# Patient Record
Sex: Female | Born: 1990 | Race: Black or African American | Hispanic: No | Marital: Single | Smoking: Never smoker
Health system: Southern US, Community
[De-identification: ages and names within clinical notes are randomized; demographics above are authoritative.]

## PROBLEM LIST (undated history)

## (undated) ENCOUNTER — Emergency Department (HOSPITAL_BASED_OUTPATIENT_CLINIC_OR_DEPARTMENT_OTHER): Admission: EM | Payer: BC Managed Care – PPO | Source: Home / Self Care

## (undated) DIAGNOSIS — R87619 Unspecified abnormal cytological findings in specimens from cervix uteri: Secondary | ICD-10-CM

## (undated) DIAGNOSIS — A64 Unspecified sexually transmitted disease: Secondary | ICD-10-CM

## (undated) HISTORY — DX: Unspecified abnormal cytological findings in specimens from cervix uteri: R87.619

## (undated) HISTORY — DX: Unspecified sexually transmitted disease: A64

---
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
## 2010-06-25 LAB — WET PREP, GENITAL: Clue Cells Wet Prep HPF POC: NONE SEEN

## 2010-06-25 LAB — GC/CHLAMYDIA PROBE AMP, GENITAL: GC Probe Amp, Genital: NEGATIVE

## 2010-06-25 LAB — POCT URINALYSIS DIPSTICK
Hgb urine dipstick: NEGATIVE
Ketones, ur: NEGATIVE mg/dL
Protein, ur: NEGATIVE mg/dL
Specific Gravity, Urine: 1.02 (ref 1.005–1.030)
pH: 7 (ref 5.0–8.0)

## 2010-06-25 LAB — POCT PREGNANCY, URINE: Preg Test, Ur: NEGATIVE

## 2010-06-28 LAB — POCT URINALYSIS DIP (DEVICE)
Glucose, UA: NEGATIVE mg/dL
Hgb urine dipstick: NEGATIVE
Nitrite: NEGATIVE
Urobilinogen, UA: 2 mg/dL — ABNORMAL HIGH (ref 0.0–1.0)
pH: 5.5 (ref 5.0–8.0)

## 2010-06-28 LAB — GC/CHLAMYDIA PROBE AMP, GENITAL: GC Probe Amp, Genital: NEGATIVE

## 2010-06-28 LAB — WET PREP, GENITAL

## 2010-07-19 ENCOUNTER — Emergency Department (HOSPITAL_COMMUNITY): Payer: BC Managed Care – HMO

## 2010-07-19 ENCOUNTER — Emergency Department (HOSPITAL_COMMUNITY)
Admission: EM | Admit: 2010-07-19 | Discharge: 2010-07-20 | Disposition: A | Discharge status: Home / Self Care | Payer: BC Managed Care – HMO | Attending: Emergency Medicine | Admitting: Emergency Medicine

## 2010-07-19 DIAGNOSIS — M545 Low back pain, unspecified: Secondary | ICD-10-CM | POA: Insufficient documentation

## 2010-07-19 DIAGNOSIS — R000 Tachycardia, unspecified: Secondary | ICD-10-CM | POA: Insufficient documentation

## 2010-07-19 DIAGNOSIS — J069 Acute upper respiratory infection, unspecified: Secondary | ICD-10-CM | POA: Insufficient documentation

## 2010-07-19 DIAGNOSIS — R110 Nausea: Secondary | ICD-10-CM | POA: Insufficient documentation

## 2010-07-19 LAB — POCT I-STAT, CHEM 8
BUN: 5 mg/dL — ABNORMAL LOW (ref 6–23)
Calcium, Ion: 1.11 mmol/L — ABNORMAL LOW (ref 1.12–1.32)
Creatinine, Ser: 1 mg/dL (ref 0.4–1.2)
Glucose, Bld: 78 mg/dL (ref 70–99)
TCO2: 19 mmol/L (ref 0–100)

## 2010-07-19 LAB — URINE MICROSCOPIC-ADD ON

## 2010-07-19 LAB — URINALYSIS, ROUTINE W REFLEX MICROSCOPIC
Protein, ur: NEGATIVE mg/dL
Urobilinogen, UA: 1 mg/dL (ref 0.0–1.0)

## 2010-07-19 LAB — POCT PREGNANCY, URINE: Preg Test, Ur: NEGATIVE

## 2011-04-13 DIAGNOSIS — A64 Unspecified sexually transmitted disease: Secondary | ICD-10-CM

## 2011-04-13 HISTORY — DX: Unspecified sexually transmitted disease: A64

## 2011-10-25 ENCOUNTER — Encounter (HOSPITAL_COMMUNITY): Payer: Self-pay

## 2011-10-25 ENCOUNTER — Emergency Department (INDEPENDENT_AMBULATORY_CARE_PROVIDER_SITE_OTHER)
Admission: EM | Admit: 2011-10-25 | Discharge: 2011-10-25 | Disposition: A | Discharge status: Home / Self Care | Payer: BC Managed Care – HMO | Source: Home / Self Care | Attending: Emergency Medicine | Admitting: Emergency Medicine

## 2011-10-25 DIAGNOSIS — J039 Acute tonsillitis, unspecified: Secondary | ICD-10-CM

## 2011-10-25 DIAGNOSIS — A499 Bacterial infection, unspecified: Secondary | ICD-10-CM

## 2011-10-25 DIAGNOSIS — B9689 Other specified bacterial agents as the cause of diseases classified elsewhere: Secondary | ICD-10-CM

## 2011-10-25 DIAGNOSIS — N760 Acute vaginitis: Secondary | ICD-10-CM

## 2011-10-25 LAB — POCT URINALYSIS DIP (DEVICE)
Bilirubin Urine: NEGATIVE
Ketones, ur: NEGATIVE mg/dL
Protein, ur: 100 mg/dL — AB

## 2011-10-25 LAB — POCT PREGNANCY, URINE: Preg Test, Ur: NEGATIVE

## 2011-10-25 MED ORDER — METRONIDAZOLE 500 MG PO TABS: 500.0000 mg | ORAL_TABLET | Freq: Two times a day (BID) | ORAL | Status: AC

## 2011-10-25 MED ORDER — AZITHROMYCIN 250 MG PO TABS: 250.0000 mg | ORAL_TABLET | Freq: Every day | ORAL | Status: AC

## 2011-10-25 NOTE — ED Provider Notes (Signed)
History     CSN: [id number]  Arrival date & time 10/25/11  1630   First MD Initiated Contact with Patient 10/25/11 1736      Chief Complaint  Patient presents with  . Vaginitis    (Consider location/radiation/quality/duration/timing/severity/associated sxs/prior treatment) Patient is a 21 y.o. female presenting with vaginal discharge. The history is provided by the patient. No language interpreter was used.  Vaginal Discharge This is a new problem. The problem occurs constantly. The problem has been gradually worsening. Nothing aggravates the symptoms. Nothing relieves the symptoms. She has tried nothing for the symptoms.  Pt complains of a vaginal odor  and a sore throat.  Pt has had Bv in the past and thinks this is the same  History reviewed. No pertinent past medical history.  History reviewed. No pertinent past surgical history.  History reviewed. No pertinent family history.  History  Substance Use Topics  . Smoking status: Never Smoker   . Smokeless tobacco: Not on file  . Alcohol Use: Yes    OB History    Grav Para Term Preterm Abortions TAB SAB Ect Mult Living                  Review of Systems  HENT: Positive for sore throat.   Genitourinary: Positive for vaginal discharge.  All other systems reviewed and are negative.    Allergies  Penicillins  Home Medications   Current Outpatient Rx  Name Route Sig Dispense Refill  . ETHYNODIOL DIAC-ETH ESTRADIOL 1-50 MG-MCG PO TABS Oral Take 1 tablet by mouth daily.      BP 143/89  Pulse 104  Temp 100.2 F (37.9 C) (Oral)  Resp 18  SpO2 100%  LMP 10/21/2011  Physical Exam  Nursing note and vitals reviewed. Constitutional: She is oriented to person, place, and time. She appears well-developed.  HENT:  Head: Normocephalic.  Right Ear: External ear normal.  Left Ear: External ear normal.  Mouth/Throat: Oropharyngeal exudate present.  Eyes: Conjunctivae and EOM are normal. Pupils are equal, round, and  reactive to light.  Neck: Normal range of motion. Neck supple.  Cardiovascular: Normal rate and normal heart sounds.   Pulmonary/Chest: Effort normal.  Abdominal: Soft.  Genitourinary: Vaginal discharge found.  Musculoskeletal:       Thin white discharge  Neurological: She is oriented to person, place, and time.  Skin: Skin is warm.  Psychiatric: She has a normal mood and affect.    ED Course  Procedures (including critical care time)   Labs Reviewed  POCT RAPID STREP A ([facility] CARE ONLY)  WET PREP, GENITAL  GC/CHLAMYDIA PROBE AMP, GENITAL   No results found.   1. Bacterial vaginitis   2. Tonsillitis       MDM  Wet prep shows clue cells,   I will treat tonsillitis with zithromax and flagyl        Elson Areas, [place] 10/25/11 1842

## 2011-10-25 NOTE — ED Provider Notes (Signed)
Medical screening examination/treatment/procedure(s) were performed by non-physician practitioner and as supervising physician I was immediately available for consultation/collaboration.  Leslee Home, M.D.   Reuben Likes, MD 10/25/11 2127

## 2011-10-25 NOTE — ED Notes (Signed)
After lengthy discussion w pt, she decided she WILL take her Rx for infection; call back number for lab issues verified

## 2011-10-25 NOTE — ED Notes (Addendum)
C/o 2 day duration of vaginal d/c, ST ; partner has had no c/o; c/o ST w red tonsils and junk on her tonsils

## 2011-10-26 LAB — GC/CHLAMYDIA PROBE AMP, GENITAL
Chlamydia, DNA Probe: POSITIVE — AB
GC Probe Amp, Genital: NEGATIVE

## 2011-11-01 ENCOUNTER — Telehealth (HOSPITAL_COMMUNITY): Payer: Self-pay | Admitting: *Deleted

## 2011-11-01 NOTE — ED Notes (Addendum)
GC neg., Chlamydia pos., Wet prep: few clue cells, few WBC's.  Pt. adequately treated with Zithromax 250 mg. # 6 ( was given for tonsilitis). I called and left a message to call on home and cell numbers.  Discussed with Dr. Lorenz Coaster and he said he does not think the tx. was adequate. He said to call in Zithromax 1 gm po x 1 dose.  DHHS form completed and will be faxed to the [facility] after Rx. called to the pharmacy.

## 2011-11-01 NOTE — ED Notes (Signed)
Pt. Called back.  Pt. verified x 2 and given results.  Pt. told she needs another one time treatment dose of Zithromax. Pt. instructed to notify her partner, no sex for 1 week and to practice safe sex. Pt. told she can get HIV testing at the [facility]. STD clinic.  She said she would call me back with the pharmacy. Vassie Moselle 11/01/2011

## 2011-11-01 NOTE — ED Notes (Signed)
Pt. called back and wants Rx. called to [facility] on [address].  Rx. called to pharmacist @ [phone].  DHHS form faxed to the [facility]. Vassie Moselle 11/01/2011

## 2012-03-09 ENCOUNTER — Encounter (HOSPITAL_COMMUNITY): Payer: Self-pay

## 2012-03-09 ENCOUNTER — Emergency Department (HOSPITAL_COMMUNITY)
Admission: EM | Admit: 2012-03-09 | Discharge: 2012-03-09 | Disposition: A | Discharge status: Home / Self Care | Payer: Self-pay | Attending: Emergency Medicine | Admitting: Emergency Medicine

## 2012-03-09 DIAGNOSIS — F1290 Cannabis use, unspecified, uncomplicated: Secondary | ICD-10-CM

## 2012-03-09 DIAGNOSIS — F10929 Alcohol use, unspecified with intoxication, unspecified: Secondary | ICD-10-CM

## 2012-03-09 DIAGNOSIS — F1010 Alcohol abuse, uncomplicated: Secondary | ICD-10-CM | POA: Insufficient documentation

## 2012-03-09 DIAGNOSIS — R450 Nervousness: Secondary | ICD-10-CM | POA: Insufficient documentation

## 2012-03-09 DIAGNOSIS — J705 Respiratory conditions due to smoke inhalation: Secondary | ICD-10-CM | POA: Insufficient documentation

## 2012-03-09 DIAGNOSIS — R443 Hallucinations, unspecified: Secondary | ICD-10-CM | POA: Insufficient documentation

## 2012-03-09 DIAGNOSIS — F1210 Cannabis abuse, uncomplicated: Secondary | ICD-10-CM | POA: Insufficient documentation

## 2012-03-09 LAB — RAPID URINE DRUG SCREEN, HOSP PERFORMED
Barbiturates: NOT DETECTED
Cocaine: NOT DETECTED
Opiates: NOT DETECTED
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
## 2012-03-09 NOTE — ED Notes (Signed)
ZOX:[id number]<AV> Expected date:03/09/12<BR> Expected time: 5:22 AM<BR> Means of arrival:Ambulance<BR> Comments:<BR> marjuana use hallucinating
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
## 2012-03-10 NOTE — ED Provider Notes (Signed)
Medical screening examination/treatment/procedure(s) were performed by non-physician practitioner and as supervising physician I was immediately available for consultation/collaboration.  Kinley Dozier, MD 03/10/12 0010 

## 2012-05-11 IMAGING — CT CT ABD-PELV W/O CM
2 of 4 series · 13 of 32 positions shown, 18 images · non-contrast
Comparison: None.

CLINICAL DATA: Right abdominal pain, nausea and vomiting.  Clinical
concern for a kidney stone.

CT ABDOMEN AND PELVIS WITHOUT CONTRAST
TECHNIQUE: Multidetector CT imaging of the abdomen and pelvis was
performed following the standard protocol without intravenous
contrast.

[Series 2: renal stone · axial · 0.70mm/px · z∈[-400,-140]mm · 5 of 80 slices shown, 10 images]
[im 14/80  soft-tissue]
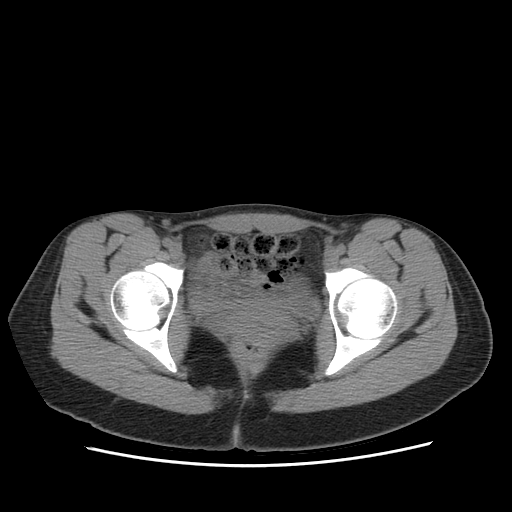
[im 14/80  bone]
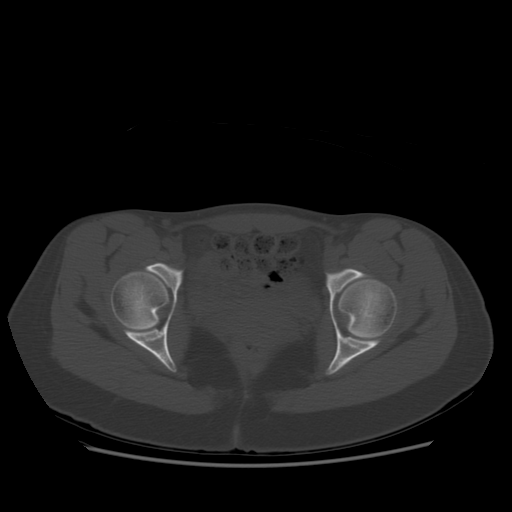
[im 27/80  soft-tissue]
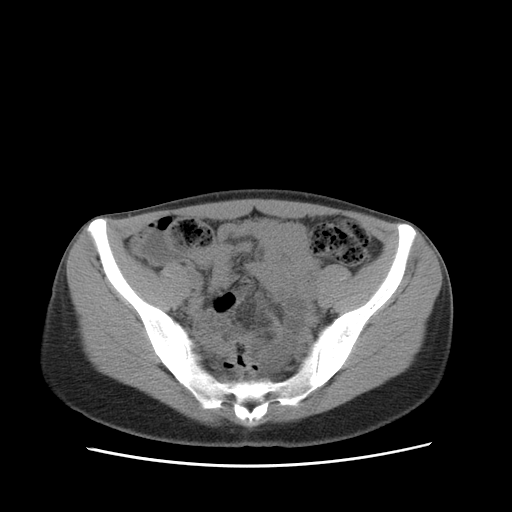
[im 27/80  lung]
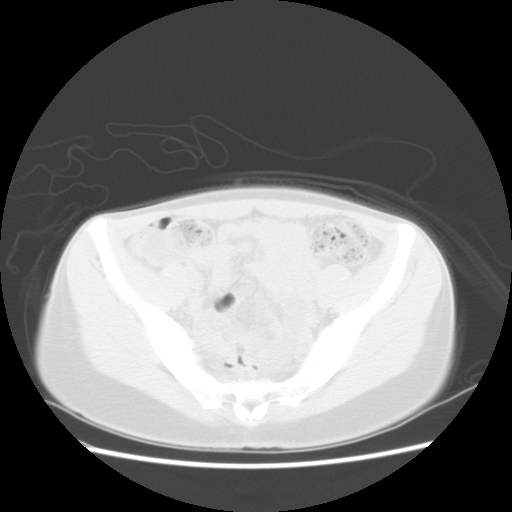
[im 40/80  soft-tissue]
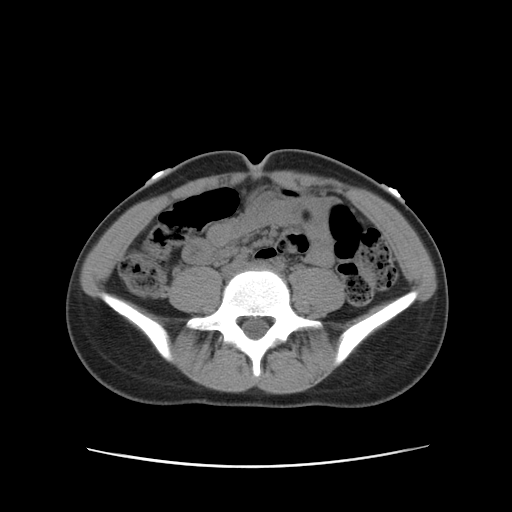
[im 40/80  lung]
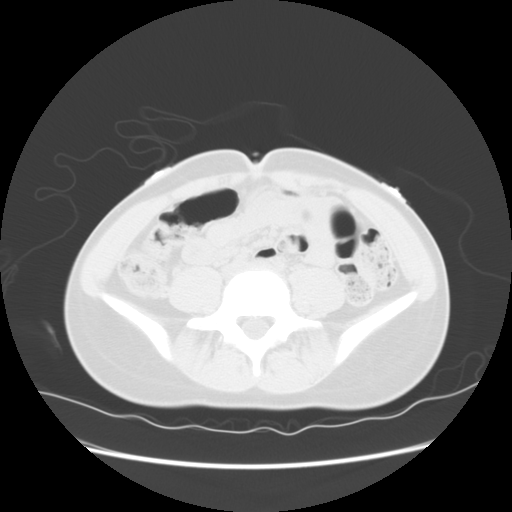
[im 53/80  soft-tissue]
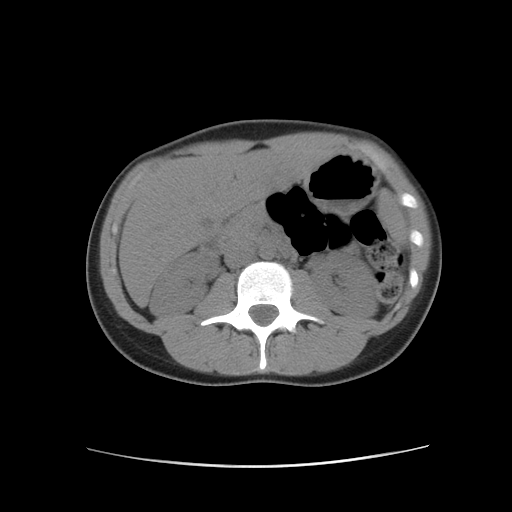
[im 53/80  lung]
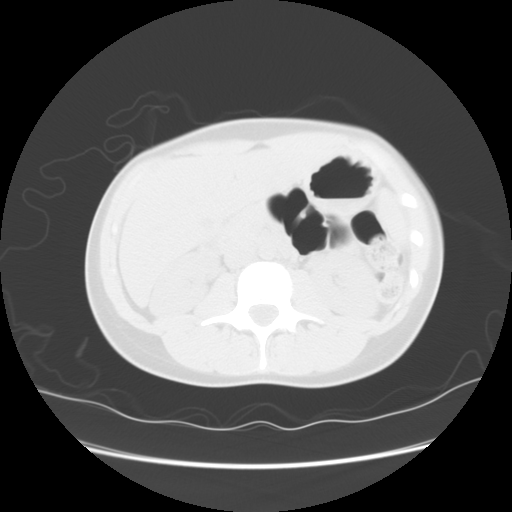
[im 66/80  soft-tissue]
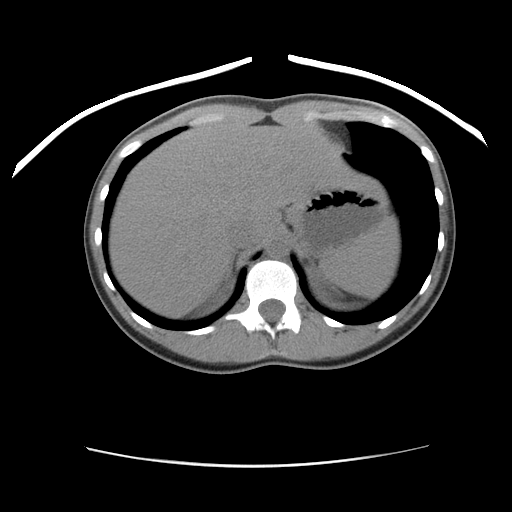
[im 66/80  lung]
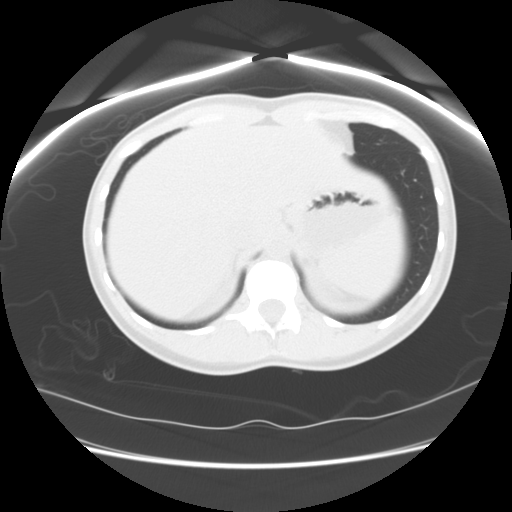

[Series 401: reformatted · sagittal · 0.81mm/px · 8 of 144 slices shown]
[im 12/144  soft-tissue]
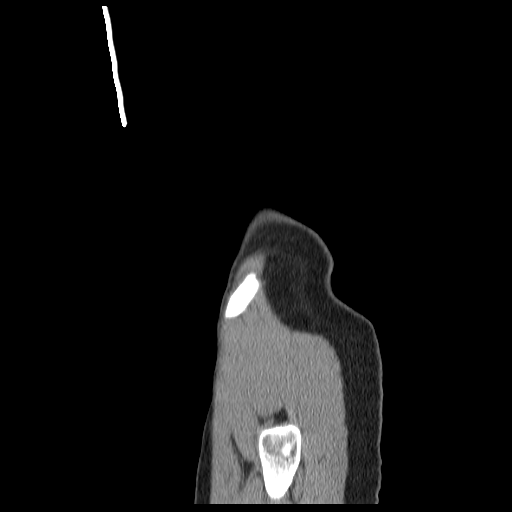
[im 36/144  soft-tissue]
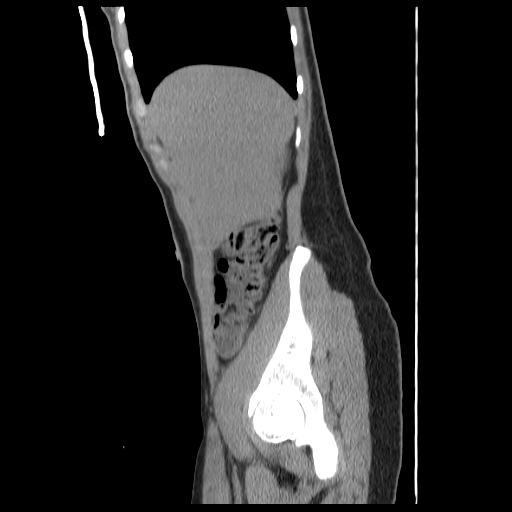
[im 48/144  soft-tissue]
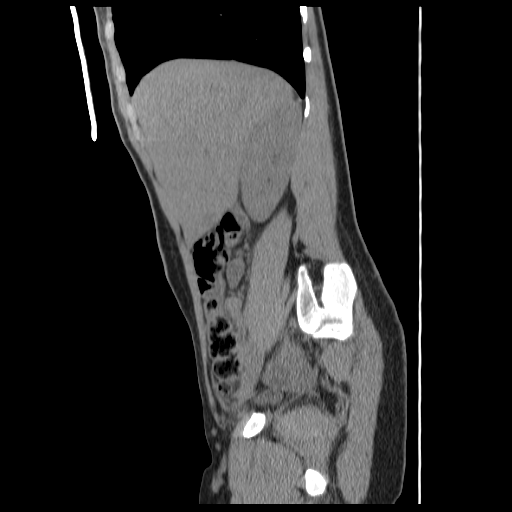
[im 60/144  soft-tissue]
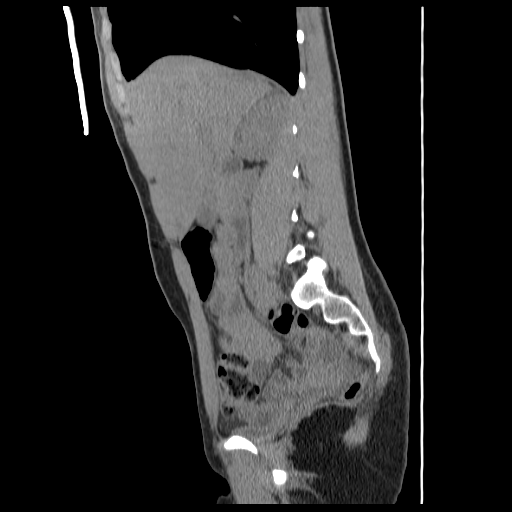
[im 84/144  soft-tissue]
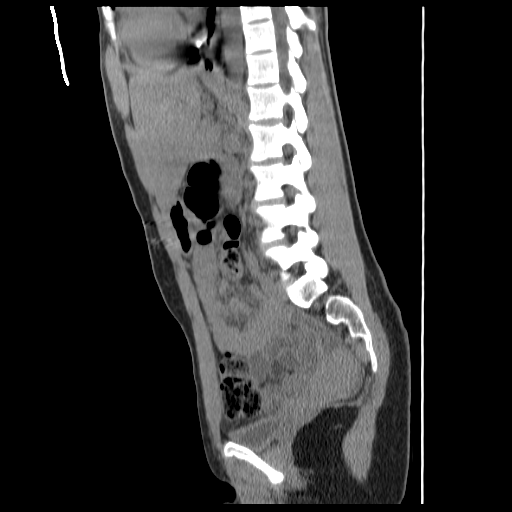
[im 96/144  soft-tissue]
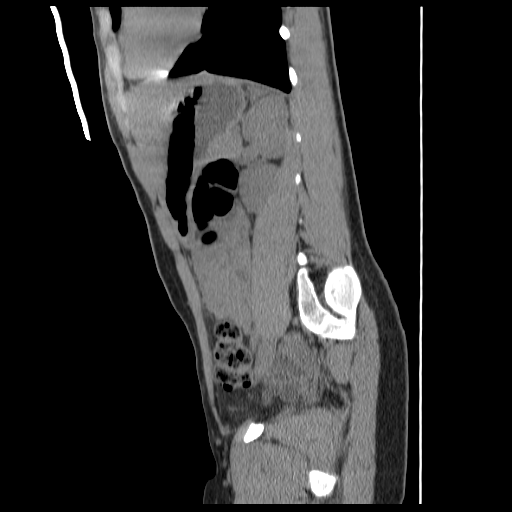
[im 108/144  soft-tissue]
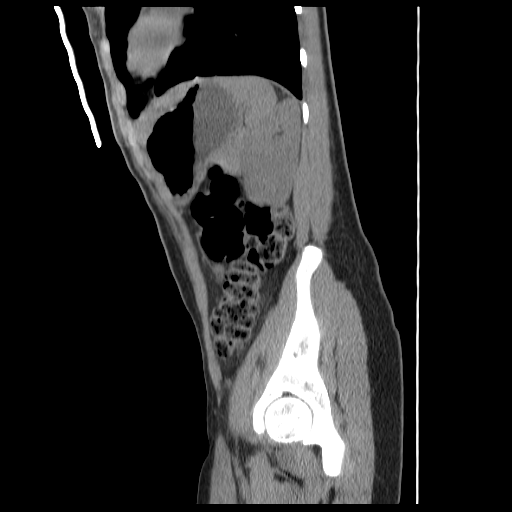
[im 132/144  soft-tissue]
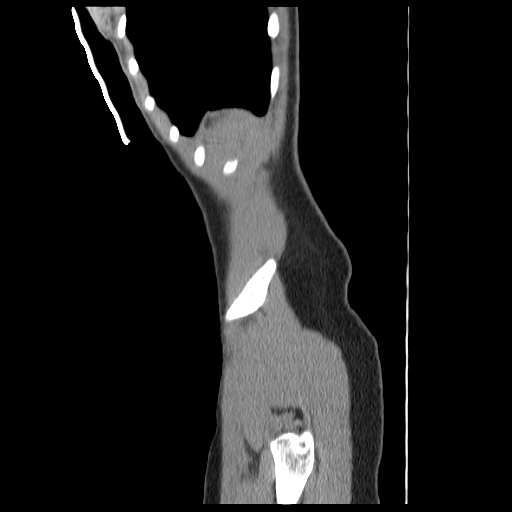

[13 of 32 positions shown; findings below may reference images not displayed]

FINDINGS: The examination is limited by the lack of intravenous and
oral contrast.  No urinary tract calculi or hydronephrosis seen.
Normal appearing appendix in the upper right pelvis.  No visible
gastrointestinal abnormalities or enlarged lymph nodes.  Normal
noncontrasted appearance of the liver, spleen, pancreas, adrenal
glands, uterus and ovaries.  Sludge or noncalcified gallstones in
the gallbladder.  No gallbladder wall thickening or pericholecystic
fluid seen.  Clear lung bases.  Normal appearing bones.
IMPRESSION: Sludge or noncalcified gallstones in the gallbladder.  Otherwise,
normal examination.

## 2012-08-10 ENCOUNTER — Emergency Department (INDEPENDENT_AMBULATORY_CARE_PROVIDER_SITE_OTHER)
Admission: EM | Admit: 2012-08-10 | Discharge: 2012-08-10 | Disposition: A | Discharge status: Home / Self Care | Payer: BC Managed Care – HMO | Source: Home / Self Care | Attending: Family Medicine | Admitting: Family Medicine

## 2012-08-10 ENCOUNTER — Encounter (HOSPITAL_COMMUNITY): Payer: Self-pay | Admitting: *Deleted

## 2012-08-10 DIAGNOSIS — N390 Urinary tract infection, site not specified: Secondary | ICD-10-CM

## 2012-08-10 LAB — POCT URINALYSIS DIP (DEVICE)
Bilirubin Urine: NEGATIVE
Ketones, ur: NEGATIVE mg/dL

## 2012-08-10 LAB — POCT PREGNANCY, URINE: Preg Test, Ur: NEGATIVE

## 2012-08-10 MED ORDER — KETOROLAC TROMETHAMINE 30 MG/ML IJ SOLN
INTRAMUSCULAR | Status: AC
  Filled 2012-08-10: qty 1

## 2012-08-10 MED ORDER — CIPROFLOXACIN HCL 500 MG PO TABS: 500.0000 mg | ORAL_TABLET | Freq: Two times a day (BID) | ORAL | Status: DC

## 2012-08-10 NOTE — ED Notes (Signed)
Pt  States  Scanty  Frequent  painfull  Urination   Today   -  sawa  Perhaps  Som eblood  Tinged   Reports  Low  abd  Discomfort    denys  Any  Other  Symptoms

## 2012-08-10 NOTE — ED Provider Notes (Signed)
History     CSN: [id number]  Arrival date & time 08/10/12  1552   First MD Initiated Contact with Patient 08/10/12 1636      Chief Complaint  Patient presents with  . Urinary Frequency    (Consider location/radiation/quality/duration/timing/severity/associated sxs/prior treatment) Patient is a 22 y.o. female presenting with frequency. The history is provided by the patient.  Urinary Frequency This is a new problem. The current episode started 1 to 2 hours ago. The problem occurs constantly. The problem has been gradually worsening. Associated symptoms include abdominal pain.    History reviewed. No pertinent past medical history.  History reviewed. No pertinent past surgical history.  No family history on file.  History  Substance Use Topics  . Smoking status: Never Smoker   . Smokeless tobacco: Not on file  . Alcohol Use: Yes    OB History   Grav Para Term Preterm Abortions TAB SAB Ect Mult Living                  Review of Systems  Constitutional: Negative.   Gastrointestinal: Positive for abdominal pain.  Genitourinary: Positive for urgency and frequency. Negative for vaginal bleeding, vaginal discharge and pelvic pain.    Allergies  Penicillins  Home Medications   Current Outpatient Rx  Name  Route  Sig  Dispense  Refill  . ciprofloxacin (CIPRO) 500 MG tablet   Oral   Take 1 tablet (500 mg total) by mouth 2 (two) times daily.   10 tablet   0   . ethynodiol-ethinyl estradiol (ZOVIA) 1-50 MG-MCG tablet   Oral   Take 1 tablet by mouth daily.           BP 120/72  Pulse 72  Temp(Src) 98.6 F (37 C) (Oral)  Resp 16  SpO2 100%  Physical Exam  Nursing note and vitals reviewed. Constitutional: She is oriented to person, place, and time. She appears well-developed and well-nourished.  Abdominal: Soft. Bowel sounds are normal. She exhibits no distension and no mass. There is tenderness. There is no rebound and no guarding.  Neurological: She is  alert and oriented to person, place, and time.  Skin: Skin is warm and dry.    ED Course  Procedures (including critical care time)  Labs Reviewed  POCT URINALYSIS DIP (DEVICE) - Abnormal; Notable for the following:    Hgb urine dipstick LARGE (*)    Protein, ur 100 (*)    Leukocytes, UA LARGE (*)    All other components within normal limits  POCT PREGNANCY, URINE   No results found.   1. UTI (lower urinary tract infection)       MDM          Linna Hoff, MD 08/13/12 [phone]

## 2014-04-12 HISTORY — PX: COLPOSCOPY: SHX161

## 2015-01-06 ENCOUNTER — Ambulatory Visit (INDEPENDENT_AMBULATORY_CARE_PROVIDER_SITE_OTHER): Payer: BLUE CROSS/BLUE SHIELD | Admitting: Obstetrics and Gynecology

## 2015-01-06 ENCOUNTER — Encounter: Payer: Self-pay | Admitting: Obstetrics and Gynecology

## 2015-01-06 VITALS — BP 122/78 | HR 80 | Resp 14 | Ht 65.5 in | Wt 126.0 lb

## 2015-01-06 DIAGNOSIS — N9089 Other specified noninflammatory disorders of vulva and perineum: Secondary | ICD-10-CM | POA: Diagnosis not present

## 2015-01-06 DIAGNOSIS — R8290 Unspecified abnormal findings in urine: Secondary | ICD-10-CM

## 2015-01-06 DIAGNOSIS — N9489 Other specified conditions associated with female genital organs and menstrual cycle: Secondary | ICD-10-CM

## 2015-01-06 DIAGNOSIS — N898 Other specified noninflammatory disorders of vagina: Secondary | ICD-10-CM

## 2015-01-06 LAB — POCT URINALYSIS DIPSTICK
BILIRUBIN UA: NEGATIVE
GLUCOSE UA: NEGATIVE
KETONES UA: NEGATIVE
NITRITE UA: NEGATIVE
Protein, UA: NEGATIVE
Urobilinogen, UA: NEGATIVE
pH, UA: 6.5

## 2015-01-06 NOTE — Progress Notes (Signed)
Patient ID: Carolyn Bridges, female   DOB: Aug 29, 1990, 24 y.o.   MRN: [id number] GYNECOLOGY  VISIT   HPI: 24 y.o.   Single  African American  female   G24P0000 with Patient's last menstrual period was 12/28/2014.   here c/o strong smelling urine. Denies dysuria, urgency, discharge or frequency. She had anal intercourse, then vaginal intercourse on Friday (doesn't think he cleaned inbetween). Her urine started having an odor on Saturday night. She feels like something is off in the vaginal area. No vulvar burning, almost itchy. No abdominal pain. Same partner x 3 years, not using condoms. On OCP's.   GYNECOLOGIC HISTORY: Patient's last menstrual period was 12/28/2014. Contraception:OCP Menopausal hormone therapy: N/A        OB History    Gravida Para Term Preterm AB TAB SAB Ectopic Multiple Living           There are no active problems to display for this patient.   Past Medical History  Diagnosis Date  . Abnormal Pap smear of cervix   . STD (sexually transmitted disease) 2013    Chlamydia-     Past Surgical History  Procedure Laterality Date  . Colposcopy  2016    normal per patinet     Current Outpatient Prescriptions  Medication Sig Dispense Refill  . ethynodiol-ethinyl estradiol (ZOVIA) 1-50 MG-MCG tablet Take 1 tablet by mouth daily.     No current facility-administered medications for this visit.     ALLERGIES: Penicillins  Family History  Problem Relation Age of Onset  . Breast cancer Maternal Grandmother   . Ovarian cancer Paternal Grandmother     Social History   Social History  . Marital Status: Single    Spouse Name: N/A  . Number of Children: N/A  . Years of Education: N/A   Occupational History  . Not on file.   Social History Main Topics  . Smoking status: Never Smoker   . Smokeless tobacco: Never Used  . Alcohol Use: No  . Drug Use: No  . Sexual Activity:    Partners: Male    Birth Control/ Protection: Pill   Other  Topics Concern  . Not on file   Social History Narrative    ROS  PHYSICAL EXAMINATION:    BP 122/78 mmHg  Pulse 80  Resp 14  Ht 5' 5.5" (1.664 m)  Wt 126 lb (57.153 kg)  BMI 20.64 kg/m2  LMP 12/28/2014    General appearance: alert, cooperative and appears stated age  Pelvic: External genitalia:  no lesions, patch of dry appearing skin on the upper left buttocks (not uncomfortable or itchy)              Urethra:  normal appearing urethra with no masses, tenderness or lesions              Bartholins and Skenes: normal                 Vagina: normal appearing vagina with normal color and discharge, no lesions              Cervix: no lesions              Bimanual Exam:  Uterus:  normal size, contour, position, consistency, mobility, non-tender              Adnexa: no mass, fullness, tenderness  Chaperone was present for exam.  Wet prep: no clue, no trich, +WBC KOH: no yeast PH: 4  ASSESSMENT Odor of the urine, dip + for leukocytes and blood, otherwise negative.  Slight vulvar discomfort (like impending infection), negative vaginal slides Patch of dry skin on the buttock  PLAN Urine for ua, c&s Wet prep probe Use Vaseline to the area of dry skin   An After Visit Summary was printed and given to the patient.

## 2015-01-06 NOTE — Patient Instructions (Signed)
Call with worsening symptoms or any other concerns. Increase water intake.

## 2015-01-07 LAB — WET PREP BY MOLECULAR PROBE
Candida species: NEGATIVE
GARDNERELLA VAGINALIS: NEGATIVE
TRICHOMONAS VAG: NEGATIVE

## 2015-01-07 LAB — URINALYSIS, MICROSCOPIC ONLY
Bacteria, UA: NONE SEEN [HPF]
CASTS: NONE SEEN [LPF]
Crystals: NONE SEEN [HPF]
RBC / HPF: NONE SEEN RBC/HPF (ref <=2)
SQUAMOUS EPITHELIAL / LPF: NONE SEEN [HPF] (ref <=5)
YEAST: NONE SEEN [HPF]

## 2015-01-08 ENCOUNTER — Telehealth: Payer: Self-pay | Admitting: Obstetrics and Gynecology

## 2015-01-08 MED ORDER — SULFAMETHOXAZOLE-TRIMETHOPRIM 800-160 MG PO TABS: 1.0000 | ORAL_TABLET | Freq: Two times a day (BID) | ORAL | Status: DC

## 2015-01-08 NOTE — Telephone Encounter (Signed)
Spoke with patient. Advised of message as seen below from Dr.Jerston. Patient is agreeable and verbalizes understanding. Rx for Bactrim DS 1 po BID x 3 days #6 0RF sent to pharmacy on file.  Routing to provider for final review. Patient agreeable to disposition. Will close encounter.

## 2015-01-08 NOTE — Telephone Encounter (Signed)
Patient calling for results.

## 2015-01-08 NOTE — Telephone Encounter (Signed)
Notes Recorded by Romualdo Bolk, MD on 01/08/2015 at 1:11 PM Please inform the patient that she does have a UTI and treat with Bactrim DS, 1 po BID x 3 days, #6, no refills. Sensitivities still pending, we will let her know if she needs a different antibiotic, but I don't want to wait to start treatment.

## 2015-01-09 ENCOUNTER — Encounter: Payer: Self-pay | Admitting: Obstetrics and Gynecology

## 2015-01-09 LAB — URINE CULTURE: Colony Count: >=100000

## 2015-01-13 ENCOUNTER — Telehealth: Payer: Self-pay | Admitting: *Deleted

## 2015-01-13 NOTE — Telephone Encounter (Signed)
Pt notified in result note.  Closing encounter. 

## 2015-01-13 NOTE — Telephone Encounter (Signed)
LMTC in regards to urine culture results -eh

## 2015-01-13 NOTE — Telephone Encounter (Signed)
-----   Message from Romualdo Bolk, MD sent at 01/13/2015  2:52 PM EDT ----- Please let the patient know that her UTI is sensitive to Bactrim, please check that she is feeling better.

## 2015-10-08 ENCOUNTER — Ambulatory Visit (INDEPENDENT_AMBULATORY_CARE_PROVIDER_SITE_OTHER): Payer: BLUE CROSS/BLUE SHIELD | Admitting: Nurse Practitioner

## 2015-10-08 ENCOUNTER — Encounter: Payer: Self-pay | Admitting: Nurse Practitioner

## 2015-10-08 VITALS — BP 110/64 | HR 68 | Temp 98.3°F | Ht 65.5 in | Wt 134.0 lb

## 2015-10-08 DIAGNOSIS — R350 Frequency of micturition: Secondary | ICD-10-CM | POA: Diagnosis not present

## 2015-10-08 DIAGNOSIS — R8290 Unspecified abnormal findings in urine: Secondary | ICD-10-CM | POA: Diagnosis not present

## 2015-10-08 DIAGNOSIS — N760 Acute vaginitis: Secondary | ICD-10-CM | POA: Diagnosis not present

## 2015-10-08 LAB — POCT URINALYSIS DIPSTICK
Bilirubin, UA: NEGATIVE
Blood, UA: NEGATIVE
Glucose, UA: NEGATIVE
Ketones, UA: NEGATIVE
Leukocytes, UA: NEGATIVE
Nitrite, UA: NEGATIVE
PROTEIN UA: NEGATIVE
UROBILINOGEN UA: NEGATIVE
pH, UA: 6

## 2015-10-08 NOTE — Patient Instructions (Signed)

## 2015-10-08 NOTE — Progress Notes (Signed)
Encounter reviewed by Dr. Daveena Elmore Amundson C. Silva.  

## 2015-10-08 NOTE — Progress Notes (Signed)
25 y.o. Single African American female G0P0000 here with complaint of maybe UTI or vaginitis. Describes discharge as normal for her.  She has concerns since symptoms stared after SA.  Thought she may have UTI or maybe vaginitis.  She also feels that she may be " over anxious' .  Onset of symptoms 3 days ago. Denies new personal products or vaginal dryness. No STD concerns same partner for 4 yrs. Urinary symptoms urgency, frequency and lower abdomina pressure . Contraception is OCP.  she [facility] years AEX as a follow of abnormal pap.  Next year she plans to be seen here since this is closer for her with job.  O:  Healthy female WDWN Affect: normal, orientation x 3  Exam: no distress Abdomen: soft and non tender, no flank pain Lymph node: no enlargement or tenderness Pelvic exam: External genital: normal female BUS: negative Vagina: thin clear discharge noted.  Affirm taken. Cervix: normal, non tender, no CMT Uterus: normal, non tender Adnexa:normal, non tender, no masses or fullness noted    A: R/O Vaginitis  R/O UTI  P: Discussed findings of vaginitis and etiology. Discussed Aveeno or baking soda sitz bath for comfort. Avoid moist clothes or pads for extended period of time. If working out in gym clothes or swim suits for long periods of time change underwear or bottoms of swimsuit if possible. Olive Oil/Coconut Oil use for skin protection prior to activity can be used to external skin.  Rx: await test results  Follow with Affirm and urine C&S  RV prn

## 2015-10-09 ENCOUNTER — Other Ambulatory Visit: Payer: Self-pay | Admitting: Certified Nurse Midwife

## 2015-10-09 DIAGNOSIS — B9689 Other specified bacterial agents as the cause of diseases classified elsewhere: Secondary | ICD-10-CM

## 2015-10-09 DIAGNOSIS — N760 Acute vaginitis: Principal | ICD-10-CM

## 2015-10-09 LAB — WET PREP BY MOLECULAR PROBE
CANDIDA SPECIES: NEGATIVE
Gardnerella vaginalis: POSITIVE — AB
Trichomonas vaginosis: NEGATIVE

## 2015-10-09 MED ORDER — METRONIDAZOLE 0.75 % VA GEL: 1.0000 | Freq: Two times a day (BID) | VAGINAL | Status: DC

## 2015-10-10 LAB — URINE CULTURE

## 2016-02-17 ENCOUNTER — Ambulatory Visit: Payer: BLUE CROSS/BLUE SHIELD | Admitting: Obstetrics and Gynecology

## 2016-02-18 ENCOUNTER — Encounter: Payer: Self-pay | Admitting: Obstetrics and Gynecology

## 2016-02-18 ENCOUNTER — Ambulatory Visit (INDEPENDENT_AMBULATORY_CARE_PROVIDER_SITE_OTHER): Payer: BLUE CROSS/BLUE SHIELD | Admitting: Obstetrics and Gynecology

## 2016-02-18 VITALS — BP 122/70 | HR 72 | Resp 14 | Wt 136.0 lb

## 2016-02-18 DIAGNOSIS — N760 Acute vaginitis: Secondary | ICD-10-CM | POA: Diagnosis not present

## 2016-02-18 DIAGNOSIS — Z113 Encounter for screening for infections with a predominantly sexual mode of transmission: Secondary | ICD-10-CM | POA: Diagnosis not present

## 2016-02-18 NOTE — Addendum Note (Signed)
Addended by: HANNER, MARTHA E on: 02/18/2016 04:04 PM   Modules accepted: Orders  

## 2016-02-18 NOTE — Progress Notes (Signed)
GYNECOLOGY  VISIT   HPI: 25 y.o.   Single  African American  female   G0P0000 with Patient's last menstrual period was 02/18/2016.   here c/o vaginal itching for the last week, getting worse. Slight odor, some increase in vaginal discharge. Was thick and looked like cottage cheese.  She also desires STD testing. She is current partner x 4 years, not using condoms.  GYNECOLOGIC HISTORY: Patient's last menstrual period was 02/18/2016. Contraception:OCP Menopausal hormone therapy: none         OB History    Gravida Para Term Preterm AB Living   0 0 0 0 0 0   SAB TAB Ectopic Multiple Live Births   0 0 0 0           There are no active problems to display for this patient.   Past Medical History:  Diagnosis Date  . Abnormal Pap smear of cervix   . STD (sexually transmitted disease) 2013   Chlamydia-     Past Surgical History:  Procedure Laterality Date  . COLPOSCOPY  2016   normal per patinet     Current Outpatient Prescriptions  Medication Sig Dispense Refill  . ethynodiol-ethinyl estradiol (ZOVIA) 1-50 MG-MCG tablet Take 1 tablet by mouth daily.     No current facility-administered medications for this visit.      ALLERGIES: Penicillins  Family History  Problem Relation Age of Onset  . Breast cancer Maternal Grandmother   . Ovarian cancer Paternal Grandmother     Social History   Social History  . Marital status: Single    Spouse name: N/A  . Number of children: N/A  . Years of education: N/A   Occupational History  . Not on file.   Social History Main Topics  . Smoking status: Never Smoker  . Smokeless tobacco: Never Used  . Alcohol use No  . Drug use: No  . Sexual activity: Yes    Partners: Male    Birth control/ protection: Pill   Other Topics Concern  . Not on file   Social History Narrative  . No narrative on file    Review of Systems  Constitutional: Negative.   HENT: Negative.   Eyes: Negative.   Respiratory: Negative.    Cardiovascular: Negative.   Gastrointestinal: Negative.   Genitourinary:       Vaginal itching  Musculoskeletal: Negative.   Skin: Negative.   Neurological: Negative.   Endo/Heme/Allergies: Negative.     PHYSICAL EXAMINATION:    BP 122/70 (BP Location: Right Arm, Patient Position: Sitting, Cuff Size: Normal)   Pulse 72   Resp 14   Wt 136 lb (61.7 kg)   LMP 02/18/2016   BMI 22.29 kg/m     General appearance: alert, cooperative and appears stated age  Pelvic: External genitalia:  no lesions              Urethra:  normal appearing urethra with no masses, tenderness or lesions              Bartholins and Skenes: normal                 Vagina: normal appearing vagina with small amount of blood and some thick yellow vaginal d/c              Cervix: no lesions               Chaperone was present for exam.  ASSESSMENT Vulvo-vaginitis Screening for STD  PLAN Wet prep probe Full STD testing Offered a steroid ointment, she declined Condom use encouraged   An After Visit Summary was printed and given to the patient.

## 2016-02-19 ENCOUNTER — Telehealth: Payer: Self-pay | Admitting: Obstetrics and Gynecology

## 2016-02-19 LAB — STD PANEL
HIV 1&2 Ab, 4th Generation: NONREACTIVE
Hepatitis B Surface Ag: NEGATIVE

## 2016-02-19 LAB — HSV(HERPES SIMPLEX VRS) I + II AB-IGG
HSV 1 Glycoprotein G Ab, IgG: 46.5 Index — ABNORMAL HIGH (ref <0.90)
HSV 2 Glycoprotein G Ab, IgG: <0.9 Index (ref <0.90)

## 2016-02-19 LAB — WET PREP BY MOLECULAR PROBE
CANDIDA SPECIES: POSITIVE — AB
GARDNERELLA VAGINALIS: POSITIVE — AB
TRICHOMONAS VAG: NEGATIVE

## 2016-02-19 LAB — GC/CHLAMYDIA PROBE AMP
CT Probe RNA: NOT DETECTED
GC Probe RNA: NOT DETECTED

## 2016-02-19 LAB — HEPATITIS C ANTIBODY: HCV AB: NEGATIVE

## 2016-02-19 MED ORDER — FLUCONAZOLE 150 MG PO TABS: 150.0000 mg | ORAL_TABLET | Freq: Once | ORAL | 0 refills | Status: AC

## 2016-02-19 MED ORDER — METRONIDAZOLE 500 MG PO TABS: 500.0000 mg | ORAL_TABLET | Freq: Two times a day (BID) | ORAL | 0 refills | Status: DC

## 2016-02-19 NOTE — Telephone Encounter (Signed)
Patient is calling to see if her results are in yet. °

## 2016-02-19 NOTE — Telephone Encounter (Signed)
-----   Message from Romualdo BolkJill Evelyn Jertson, MD sent at 02/19/2016  4:20 PM EST ----- Please inform the patient that her vaginitis probe was + for BV and treat with flagyl (either oral or vaginal, her choice), no ETOH while on Flagyl.  Oral: Flagyl 500 mg BID x 7 days, or Vaginal: Metrogel, 1 applicator per vagina q day x 5 days. Also + for yeast, treat with diflucan 150 mg x 1, may repeat in 72 hours if still symptomatic. #2, no refills +for HSV 1, IgG(usually associated with cold sores, can occur in the genital area), check for any history or symptoms HIV and HSV IgM is pending (IgM is a new infection), IgG is an old infection The rest of her testing was negative

## 2016-02-19 NOTE — Telephone Encounter (Signed)
Spoke with patient. Patient requesting lab results from 02/18/16. Advised patient labs have not been reviewed by Dr. Oscar LaJertson. Advised patient will review with Dr. Oscar LaJertson  return call with results. Advised patient Dr. Oscar LaJertson is seeing patients, response may not be immediate. Patient states she just wants to get the medication she needs. Patient is agreeable.   Dr. Oscar LaJertson, can you review lab results?

## 2016-02-19 NOTE — Telephone Encounter (Signed)
Spoke with patient, advised of results as seen below. Order placed for Flagyl 500mg  PO BID x7days #14/0RF and Diflucan 150mg  #2/0RF. 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 Patient denies any vaginal lesions/sores. Patient verbalizes understanding and is agreeable.   Dr. Oscar LaJertson, ok to close encounter?

## 2016-02-22 LAB — HSV 1/2 AB (IGM), IFA W/RFLX TITER
HSV 1 IgM Screen: NEGATIVE
HSV 2 IgM Screen: NEGATIVE

## 2016-03-02 NOTE — Telephone Encounter (Signed)
Will close encounter -pending labs reported to patient 11/14 -see lab result note.  Routing to provider for final review. Patient is agreeable to disposition. Will close encounter.

## 2016-06-03 ENCOUNTER — Telehealth: Payer: Self-pay | Admitting: Obstetrics and Gynecology

## 2016-06-03 ENCOUNTER — Ambulatory Visit: Payer: BLUE CROSS/BLUE SHIELD | Admitting: Obstetrics and Gynecology

## 2016-06-03 NOTE — Telephone Encounter (Signed)
Patient canceled her appointment for her yeast infection today. Patient said that she was feeling better and does not need the appointment.

## 2016-06-04 ENCOUNTER — Ambulatory Visit (INDEPENDENT_AMBULATORY_CARE_PROVIDER_SITE_OTHER): Payer: BLUE CROSS/BLUE SHIELD | Admitting: Nurse Practitioner

## 2016-06-04 ENCOUNTER — Encounter: Payer: Self-pay | Admitting: Nurse Practitioner

## 2016-06-04 VITALS — BP 102/58 | HR 80 | Resp 16 | Wt 137.0 lb

## 2016-06-04 DIAGNOSIS — N760 Acute vaginitis: Secondary | ICD-10-CM

## 2016-06-04 MED ORDER — FLUCONAZOLE 150 MG PO TABS: 150.0000 mg | ORAL_TABLET | Freq: Once | ORAL | 0 refills | Status: AC

## 2016-06-04 NOTE — Progress Notes (Signed)
26 y.o. Single African American female G0P0000 here with complaint of vaginal symptoms of itching, burning, and increase discharge. Describes discharge as white, cottage cheese.  Took Diflucan 150 mg on Thursday. Onset of symptoms 3 days ago. Denies new personal products or vaginal dryness. Patient used new laundry detergent about 3 weeks ago. No STD concerns, same partner for 4 yrs. Urinary symptoms none . Contraception is OCP.   O:  Healthy female WDWN Affect: normal, orientation x 3  Exam: no distress Abdomen: soft and non tender Lymph node: no enlargement or tenderness Pelvic exam: External genital: normal female BUS: negative Vagina: very thick cottage cheese discharge noted.  Affirm taken. Cervix: normal, non tender, no CMT Uterus: normal, non tender Adnexa:normal, non tender, no masses or fullness noted    A: Vaginitis - most likely yeast but has hx of BV as well.   P: Discussed findings of vaginitis and etiology. Discussed Aveeno or baking soda sitz bath for comfort. Avoid moist clothes or pads for extended period of time. If working out in gym clothes or swim suits for long periods of time change underwear or bottoms of swimsuit if possible. Olive Oil/Coconut Oil use for skin protection prior to activity can be used to external skin.  Rx: Diflucan 150 mg  Follow with Affirm  If BV is present will treat with Flagyl PO  She is also asked to get Pro B OTC RV prn

## 2016-06-04 NOTE — Patient Instructions (Signed)
Vaginal Yeast infection, Adult Vaginal yeast infection is a condition that causes soreness, swelling, and redness (inflammation) of the vagina. It also causes vaginal discharge. This is a common condition. Some women get this infection frequently. What are the causes? This condition is caused by a change in the normal balance of the yeast (candida) and bacteria that live in the vagina. This change causes an overgrowth of yeast, which causes the inflammation. What increases the risk? This condition is more likely to develop in:  Women who take antibiotic medicines.  Women who have diabetes.  Women who take birth control pills.  Women who are pregnant.  Women who douche often.  Women who have a weak defense (immune) system.  Women who have been taking steroid medicines for a long time.  Women who frequently wear tight clothing. What are the signs or symptoms? Symptoms of this condition include:  White, thick vaginal discharge.  Swelling, itching, redness, and irritation of the vagina. The lips of the vagina (vulva) may be affected as well.  Pain or a burning feeling while urinating.  Pain during sex. How is this diagnosed? This condition is diagnosed with a medical history and physical exam. This will include a pelvic exam. Your health care provider will examine a sample of your vaginal discharge under a microscope. Your health care provider may send this sample for testing to confirm the diagnosis. How is this treated? This condition is treated with medicine. Medicines may be over-the-counter or prescription. You may be told to use one or more of the following:  Medicine that is taken orally.  Medicine that is applied as a cream.  Medicine that is inserted directly into the vagina (suppository). Follow these instructions at home:  Take or apply over-the-counter and prescription medicines only as told by your health care provider.  Do not have sex until your health care  provider has approved. Tell your sex partner that you have a yeast infection. That person should go to his or her health care provider if he or she develops symptoms.  Do not wear tight clothes, such as pantyhose or tight pants.  Avoid using tampons until your health care provider approves.  Eat more yogurt. This may help to keep your yeast infection from returning.  Try taking a sitz bath to help with discomfort. This is a warm water bath that is taken while you are sitting down. The water should only come up to your hips and should cover your buttocks. Do this 3-4 times per day or as told by your health care provider.  Do not douche.  Wear breathable, cotton underwear.  If you have diabetes, keep your blood sugar levels under control. Contact a health care provider if:  You have a fever.  Your symptoms go away and then return.  Your symptoms do not get better with treatment.  Your symptoms get worse.  You have new symptoms.  You develop blisters in or around your vagina.  You have blood coming from your vagina and it is not your menstrual period.  You develop pain in your abdomen. This information is not intended to replace advice given to you by your health care provider. Make sure you discuss any questions you have with your health care provider. Document Released: 01/06/2005 Document Revised: 09/10/2015 Document Reviewed: 09/30/2014 Elsevier Interactive Patient Education  2017 [employer]  Pro B

## 2016-06-05 LAB — WET PREP BY MOLECULAR PROBE
Candida species: NEGATIVE
Gardnerella vaginalis: NEGATIVE
Trichomonas vaginosis: NEGATIVE

## 2016-06-06 NOTE — Progress Notes (Signed)
Encounter reviewed by Dr. Brook Amundson C. Silva.  

## 2016-06-07 NOTE — Telephone Encounter (Signed)
Patient requesting her results °

## 2016-06-07 NOTE — Telephone Encounter (Signed)
Agree with plan 

## 2016-06-07 NOTE — Telephone Encounter (Signed)
Left message to call [name] at [phone].  Notes Recorded by Ria CommentPatricia Grubb, FNP on 06/05/2016 at 11:25 AM EST Please let pt know that wet prep was negative. She does not need further medication.

## 2016-06-07 NOTE — Telephone Encounter (Signed)
Patient returning your call.

## 2016-06-07 NOTE — Telephone Encounter (Signed)
Spoke with patient. Advised of results as seen below from Ria CommentPatricia Grubb, FNP. Patient verbalizes understanding. States she is still having slight itching and odor when using the restroom. Dipped hr urine at work this morning and reports everything was normal. Advised with affirm testing returning normal no further medication is needed at this time. Advised her body may still be trying to balance out vaginal ph after having symptoms and taking Diflucan. Patient is agreeable. Advised I will review with Ria CommentPatricia Grubb, FNP and return call with any further recommendations. Patient is agreeable.

## 2018-01-25 ENCOUNTER — Ambulatory Visit: Payer: BLUE CROSS/BLUE SHIELD | Admitting: Certified Nurse Midwife

## 2018-01-25 ENCOUNTER — Encounter: Payer: Self-pay | Admitting: Certified Nurse Midwife

## 2018-01-25 ENCOUNTER — Telehealth: Payer: Self-pay | Admitting: Certified Nurse Midwife

## 2018-01-25 NOTE — Telephone Encounter (Signed)
Patient cancelled her problem visit for Thursday because she got in with her pcp office.

## 2018-01-25 NOTE — Progress Notes (Deleted)
27 y.o. Single {Race/ethnicity:17218} female G0P0000 here with complaint of vaginal symptoms of itching, burning, and increase discharge. Describes discharge as ***. Onset of symptoms *** days ago. Denies new personal products or vaginal dryness. *** STD concerns. Urinary symptoms *** . Contraception is  ROS  O:Healthy female WDWN Affect: normal, orientation x 3  Exam: Abdomen: Lymph node: no enlargement or tenderness Pelvic exam: External genital: normal female BUS: negative Vagina: *** discharge noted. Ph:   ,Wet prep taken, Affirm taken Cervix: normal, non tender, no CMT Uterus: normal, non tender Adnexa:normal, non tender, no masses or fullness noted   Wet Prep results:   A:Normal pelvic exam   P:Discussed findings of *** and etiology. Discussed Aveeno or baking soda sitz bath for comfort. Avoid moist clothes or pads for extended period of time. If working out in gym clothes or swim suits for long periods of time change underwear or bottoms of swimsuit if possible. Coconut Oil use for skin protection prior to activity can be used to external skin for protection or dryness. Rx: ***  Rv prn

## 2018-01-26 ENCOUNTER — Ambulatory Visit: Payer: BLUE CROSS/BLUE SHIELD | Admitting: Certified Nurse Midwife

## 2022-04-12 ENCOUNTER — Encounter (HOSPITAL_BASED_OUTPATIENT_CLINIC_OR_DEPARTMENT_OTHER): Payer: Self-pay

## 2022-04-12 ENCOUNTER — Other Ambulatory Visit: Payer: Self-pay

## 2022-04-12 ENCOUNTER — Emergency Department (HOSPITAL_BASED_OUTPATIENT_CLINIC_OR_DEPARTMENT_OTHER)
Admission: EM | Admit: 2022-04-12 | Discharge: 2022-04-12 | Disposition: A | Discharge status: Home / Self Care | Payer: BC Managed Care – PPO | Attending: Emergency Medicine | Admitting: Emergency Medicine

## 2022-04-12 DIAGNOSIS — W25XXXA Contact with sharp glass, initial encounter: Secondary | ICD-10-CM | POA: Insufficient documentation

## 2022-04-12 DIAGNOSIS — S61216A Laceration without foreign body of right little finger without damage to nail, initial encounter: Secondary | ICD-10-CM | POA: Diagnosis present

## 2022-04-12 DIAGNOSIS — Y990 Civilian activity done for income or pay: Secondary | ICD-10-CM | POA: Diagnosis not present

## 2022-04-12 MED ORDER — BACITRACIN ZINC 500 UNIT/GM EX OINT: TOPICAL_OINTMENT | Freq: Once | CUTANEOUS | Status: DC

## 2022-04-12 MED ORDER — LIDOCAINE-EPINEPHRINE (PF) 2 %-1:200000 IJ SOLN
10.0000 mL | Freq: Once | INTRAMUSCULAR | Status: AC
  Administered 2022-04-12: 10 mL
  Filled 2022-04-12: qty 20

## 2022-04-12 NOTE — ED Notes (Signed)
Wound/laceration prev wrapped before arrival, not currently bleeding and able to move fingers... Does state trouble with sensation to the affected finger and ring finger... Cap refill in both.Marland KitchenMarland Kitchen

## 2022-04-12 NOTE — ED Notes (Signed)
Wound bandaged with bacitracin and a band aid.

## 2022-04-12 NOTE — ED Notes (Signed)
CN called to speak with pt in w/r. Pt has multiple various complaints. Pt lists: "lack of clean environment, lack of introductions, lack of professionalism, feels unsafe". This RN listened, verbalized appreciation for her sharing, updated her with wait time, encouraged her with plan to share information with staff, staff huddle and leadership. Previously offered xray was declined. Offered pain med at this time which she declined. Denies other questions or needs at this time.

## 2022-04-12 NOTE — Discharge Instructions (Addendum)
Placed dissolvable stitches in your finger, these can take 3 to 4 weeks to dissolve on their own, but after around 7 to 10 days he can have them removed if they are irritating to you.  Please monitor for signs of infection including worsening redness, purulence, swelling, increased pain, and return for further evaluation if you see any of the signs.  Please use Tylenol or ibuprofen for pain.  You may use 600 mg ibuprofen every 6 hours or 1000 mg of Tylenol every 6 hours.  You may choose to alternate between the 2.  This would be most effective.  Not to exceed 4 g of Tylenol within 24 hours.  Not to exceed 3200 mg ibuprofen 24 hours.  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

## 2022-04-12 NOTE — ED Notes (Signed)
Discharge paperwork given and verbally understood. 

## 2022-04-12 NOTE — ED Triage Notes (Signed)
States cut small finger on glass last night at work  Small laceration to inside finger Bleeding controlled

## 2022-04-12 NOTE — ED Notes (Signed)
Surture cart and Lido left at bedside... Provider aware.Marland KitchenMarland Kitchen

## 2022-04-12 NOTE — ED Provider Notes (Signed)
[facility] EMERGENCY DEPT Provider Note   CSN: [id number] Arrival date & time: 04/12/22  4098     History  Chief Complaint  Patient presents with   Laceration    Carolyn Bridges is a 32 y.o. female with noncontributory past medical history who presents with concern for cut on right 5th small finger last night while at work. Patient reports tetanus up to date as of 2019.  She reports bleeding controlled at this time, the wound occurred late last night so it is been less than 12 hours since initial incident.  She reports that it is extremely painful.  She has not taken anything for pain prior to arrival.   Laceration      Home Medications Prior to Admission medications   Medication Sig Start Date End Date Taking? Authorizing Provider  ethynodiol-ethinyl estradiol (ZOVIA) 1-50 MG-MCG tablet Take 1 tablet by mouth daily.    [provider]      Allergies    Penicillins    Review of Systems   Review of Systems  Skin:  Positive for wound.  All other systems reviewed and are negative.   Physical Exam Updated Vital Signs BP 116/87 (BP Location: Right Arm)   Pulse 80   Temp 98.1 F (36.7 C) (Oral)   Resp 16   Ht 5\' 5"  (1.651 m)   Wt 70.3 kg   LMP 04/12/2022 (Exact Date)   SpO2 100%   BMI 25.79 kg/m  Physical Exam Vitals and nursing note reviewed.  Constitutional:      General: She is not in acute distress.    Appearance: Normal appearance.  HENT:     Head: Normocephalic and atraumatic.  Eyes:     General:        Right eye: No discharge.        Left eye: No discharge.  Cardiovascular:     Rate and Rhythm: Normal rate and regular rhythm.  Pulmonary:     Effort: Pulmonary effort is normal. No respiratory distress.  Musculoskeletal:        General: No deformity.     Comments: Intact strength to flexion, extension of the affected digit  Skin:    General: Skin is warm and dry.     Comments: Patient with approximately 2 and half centimeter  laceration on the right small pinky on the inside of the lateral aspect of the proximal interphalangeal joint, no evidence of foreign body, exposed tendon  Neurological:     Mental Status: She is alert and oriented to person, place, and time.  Psychiatric:        Mood and Affect: Mood normal.        Behavior: Behavior normal.     ED Results / Procedures / Treatments   Labs (all labs ordered are listed, but only abnormal results are displayed) Labs Reviewed - No data to display  EKG None  Radiology No results found.  Procedures .03/01/2024Laceration Repair  Date/Time: 04/12/2022 10:59 AM  Performed by: 06/11/2022, PA-C Authorized by: Olene Floss, PA-C   Consent:    Consent obtained:  Verbal   Consent given by:  Patient   Risks, benefits, and alternatives were discussed: yes     Risks discussed:  Infection, pain, need for additional repair, poor cosmetic result, retained foreign body, nerve damage and poor wound healing   Alternatives discussed:  No treatment Universal protocol:    Procedure explained and questions answered to patient or proxy's satisfaction: yes  Patient identity confirmed:  Verbally with patient Anesthesia:    Anesthesia method:  Nerve block   Block location:  See additional procedure note   Block outcome:  Anesthesia achieved Laceration details:    Location:  Finger   Finger location:  R small finger   Length (cm):  2.5   Depth (mm):  4 Treatment:    Area cleansed with:  Povidone-iodine and Shur-Clens   Amount of cleaning:  Standard Skin repair:    Repair method:  Sutures   Suture size:  5-0   Suture material:  Chromic gut   Suture technique:  Simple interrupted   Number of sutures:  3 Approximation:    Approximation:  Close Repair type:    Repair type:  Simple Post-procedure details:    Dressing:  Antibiotic ointment and non-adherent dressing   Procedure completion:  Tolerated .Nerve Block  Date/Time: 04/12/2022 11:00  AM  Performed by: Anselmo Pickler, PA-C Authorized by: Anselmo Pickler, PA-C   Consent:    Consent obtained:  Verbal   Consent given by:  Patient   Risks, benefits, and alternatives were discussed: yes     Risks discussed:  Nerve damage, infection, pain, unsuccessful block, swelling and bleeding   Alternatives discussed:  No treatment Universal protocol:    Procedure explained and questions answered to patient or proxy's satisfaction: yes     Patient identity confirmed:  Verbally with patient Indications:    Indications:  Procedural anesthesia Location:    Body area:  Upper extremity   Upper extremity nerve blocked: Digital nerve block of right fifth finger. Skin anesthesia:    Skin anesthesia method:  Local infiltration   Local anesthetic:  Lidocaine 2% WITH epi Procedure details:    Block needle gauge:  25 G   Anesthetic injected:  Lidocaine 2% WITH epi   Injection procedure:  Anatomic landmarks identified, negative aspiration for blood, introduced needle, incremental injection and anatomic landmarks palpated Post-procedure details:    Outcome:  Anesthesia achieved   Procedure completion:  Tolerated     Medications Ordered in ED Medications  bacitracin ointment (has no administration in time range)  lidocaine-EPINEPHrine (XYLOCAINE W/EPI) 2 %-1:200000 (PF) injection 10 mL (10 mLs Infiltration Given by Other 04/12/22 0940)    ED Course/ Medical Decision Making/ A&P                           Medical Decision Making Risk OTC drugs. Prescription drug management.   This is an overall well-appearing 32 year old female who presents with concern for laceration of her right fifth finger.  The laceration is located on PE medial aspect of the proximal interphalangeal joint towards the volar surface.  There is no evidence of foreign body, exposed tendon, she has intact flexion, extension, is overall neurovascularly intact throughout.  She has good capillary refill distal  to the injury.  The wound is around 2.5 cm in length, laceration repaired as described above after digital block of the digit, patient tolerated the procedure without difficulty, wound cleaned thoroughly, and patient discharged with extensive wound care precautions.  She is discharged in stable condition at this time. Final Clinical Impression(s) / ED Diagnoses Final diagnoses:  Laceration of right little finger without foreign body without damage to nail, initial encounter    Rx / DC Orders ED Discharge Orders     None         Anselmo Pickler, PA-C 04/12/22 1103  Tegeler, Gwenyth Allegra, MD 04/12/22 [phone]

## 2022-04-14 ENCOUNTER — Other Ambulatory Visit: Payer: Self-pay

## 2022-04-14 ENCOUNTER — Ambulatory Visit: Payer: Self-pay

## 2022-04-14 NOTE — Telephone Encounter (Signed)
  Chief Complaint: numbness to tip of little finger Symptoms: numbness, pain when touched Frequency: today Pertinent Negatives: Patient denies redness, swelling, abnormal color or drainage, cold to touch Disposition: [x] ED /[] Urgent Care (no appt availability in office) / [] Appointment(In office/virtual)/ []  [facility]/ [] Home Care/ [] Refused Recommended Disposition /[] [facility]/ []  Follow-up with PCP Additional Notes: sent back to ED for evaluation. Reason for Disposition  [1] Cut AND [2] numbness (loss of sensation) of finger  Answer Assessment - Initial Assessment Questions 1. MECHANISM: "How did the injury happen?"      Cut to finger (see RD noted from 04/12/22 2. ONSET: "When did the injury happen?" (Minutes or hours ago)      04/12/22 3. LOCATION: "What part of the finger is injured?" "Is the nail damaged?"      Post repair 4. APPEARANCE of the INJURY: "What does the injury look like?"      Normal in appearance and skin temp and color 5. SEVERITY: "Can you use the hand normally?"  "Can you bend your fingers into a ball and then fully open them?"     yes 6. SIZE: For cuts, bruises, or swelling, ask: "How large is it?" (e.g., inches or centimeters;  entire finger)      N/ 7. PAIN: "Is there pain?" If Yes, ask: "How bad is the pain?"    (e.g., Scale 1-10; or mild, moderate, severe)  - NONE (0): no pain.  - MILD (1-3): doesn't interfere with normal activities.   - MODERATE (4-7): interferes with normal activities or awakens from sleep.  - SEVERE (8-10): excruciating pain, unable to hold a glass of water or bend finger even a little.     MILD 8. TETANUS: For any breaks in the skin, ask: "When was the last tetanus booster?"     N/A 9. OTHER SYMPTOMS: "Do you have any other symptoms?"     Numbness to tip of pinky finger after suturing 10. PREGNANCY: "Is there any chance you are pregnant?" "When was your last menstrual period?"       N/a  Protocols used: Finger  Injury-A-AH
